# Patient Record
Sex: Female | Born: 1985 | Race: White | Hispanic: No | Marital: Married | Smoking: Never smoker
Health system: Southern US, Community
[De-identification: ages and names within clinical notes are randomized; demographics above are authoritative.]

## PROBLEM LIST (undated history)

## (undated) DIAGNOSIS — R0789 Other chest pain: Secondary | ICD-10-CM

## (undated) DIAGNOSIS — I471 Supraventricular tachycardia, unspecified: Secondary | ICD-10-CM

## (undated) DIAGNOSIS — R002 Palpitations: Secondary | ICD-10-CM

## (undated) HISTORY — DX: Palpitations: R00.2

## (undated) HISTORY — DX: Other chest pain: R07.89

## (undated) HISTORY — DX: Supraventricular tachycardia, unspecified: I47.10

## (undated) HISTORY — PX: CHOLECYSTECTOMY: SHX55

## (undated) HISTORY — DX: Supraventricular tachycardia: I47.1

## (undated) HISTORY — PX: APPENDECTOMY: SHX54

---
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
## 2003-09-01 ENCOUNTER — Encounter: Admission: RE | Admit: 2003-09-01 | Discharge: 2003-09-01 | Payer: Self-pay | Admitting: Family Medicine

## 2003-09-01 ENCOUNTER — Encounter (INDEPENDENT_AMBULATORY_CARE_PROVIDER_SITE_OTHER): Payer: Self-pay | Admitting: Specialist

## 2003-09-02 ENCOUNTER — Observation Stay (HOSPITAL_COMMUNITY): Admission: EM | Admit: 2003-09-02 | Discharge: 2003-09-02 | Payer: Self-pay | Admitting: Emergency Medicine

## 2003-10-13 ENCOUNTER — Other Ambulatory Visit: Admission: RE | Admit: 2003-10-13 | Discharge: 2003-10-13 | Payer: Self-pay | Admitting: Family Medicine

## 2004-04-22 IMAGING — CT CT ABDOMEN W/ CM
1 series · 15 of 32 positions shown, 19 images · IV contrast (GASTRO & OMNIPAQUE 100)
Comparison: none

CLINICAL DATA: Right lower quadrant pain.    Question appendicitis. 
 CT OF THE ABDOMEN AND PELVIS WITH IV CONTRAST 
 Multidetector helical study performed during IV contrast enhancement of 100 cc of Omnipaque 300 injected at 3 cc per second.  The liver, spleen, pancreas, kidneys and adrenal glands have a normal appearance.  There is no free peritoneal fluid or air.  The appendix is in a retrocecal location and is enlarged measuring 12 mm in diameter.  There is inflammatory change seen within the periappendiceal fat ? findings consistent with acute appendicitis.  There is no evidence for an abscess.
 IMPRESSION
 Findings are consistent with acute retrocecal appendicitis without abscess. 
 CT OF THE PELVIS WITH IV CONTRAST 
 There is a small amount of free pelvic fluid present.  There is a 1.7 x 3.3 cm in size right ovarian cyst.  There is no adenopathy or pelvic mass. There are changes consistent with retrocecal appendicitis as discussed on the CT of the abdomen report. 
 Retrocecal appendicitis.  Please see CT abdomen report. 
 [DATE] x 3.3 cm in size right ovarian cyst with a small amount of free pelvic fluid.

[Series 2: routine abdomen · axial · 0.64mm/px · z∈[-350,-10]mm · 15 of 105 slices shown, 19 images]
[im 7/105  soft-tissue]
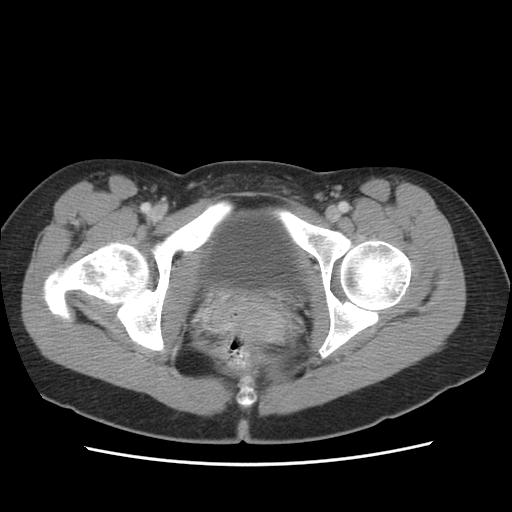
[im 7/105  bone]
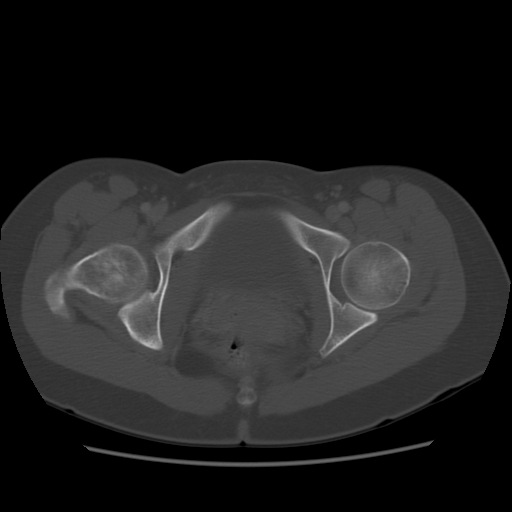
[im 14/105  soft-tissue]
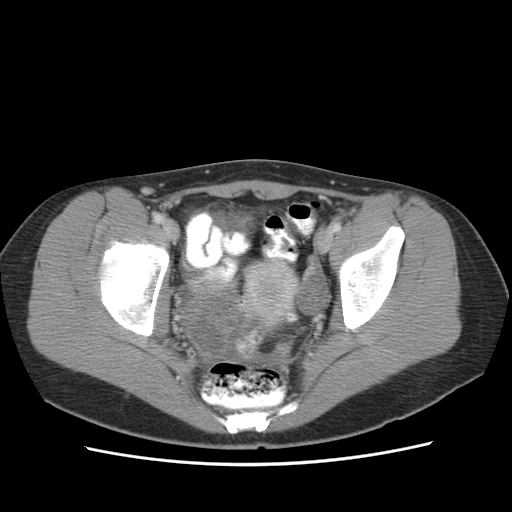
[im 21/105  soft-tissue]
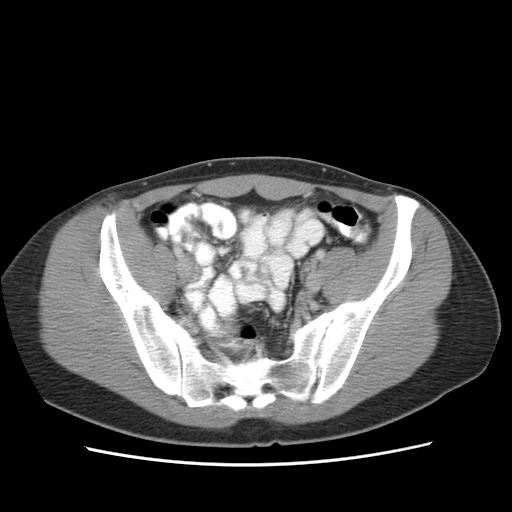
[im 31/105  soft-tissue]
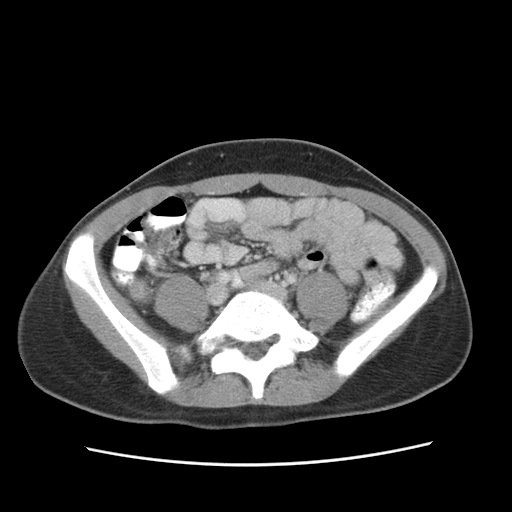
[im 37/105  soft-tissue]
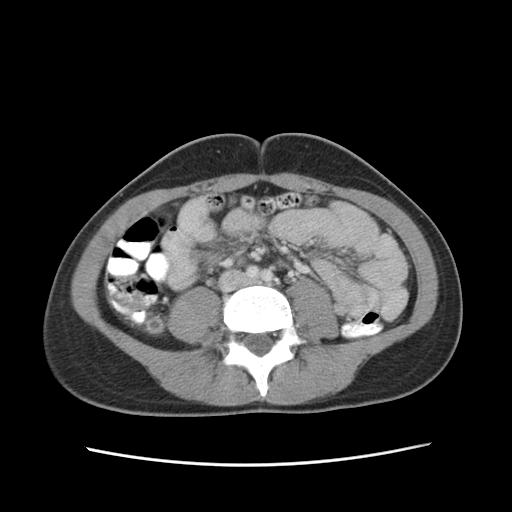
[im 44/105  soft-tissue]
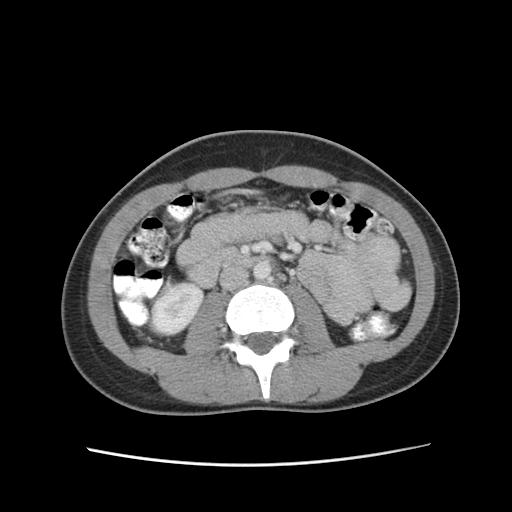
[im 54/105  soft-tissue]
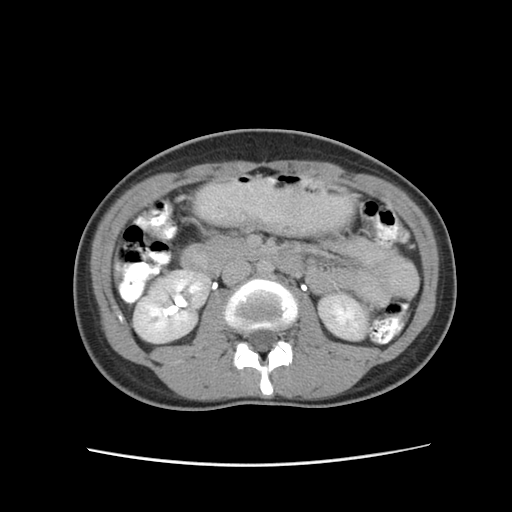
[im 61/105  soft-tissue]
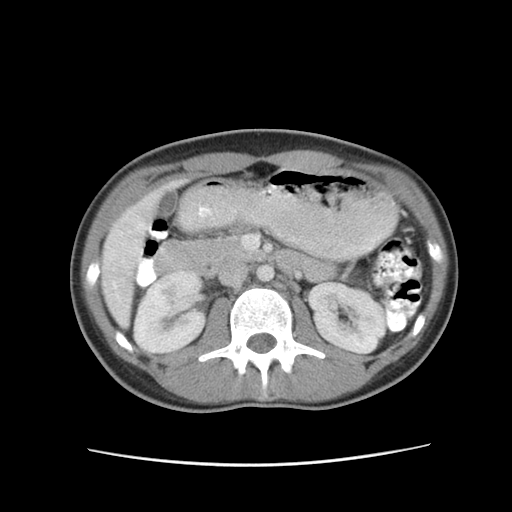
[im 68/105  soft-tissue]
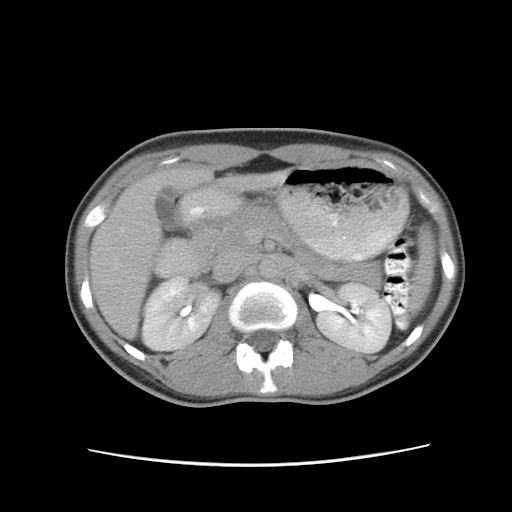
[im 68/105  bone]
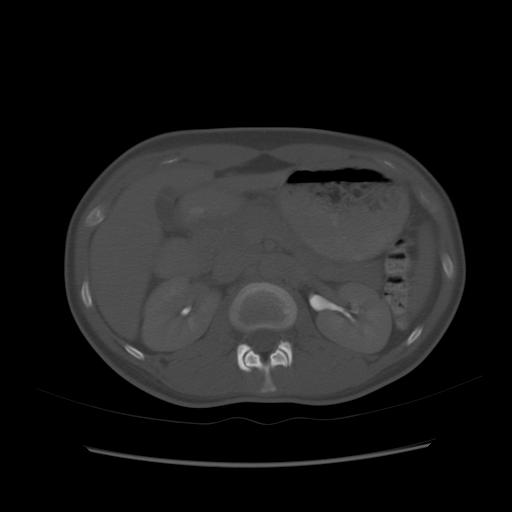
[im 74/105  soft-tissue]
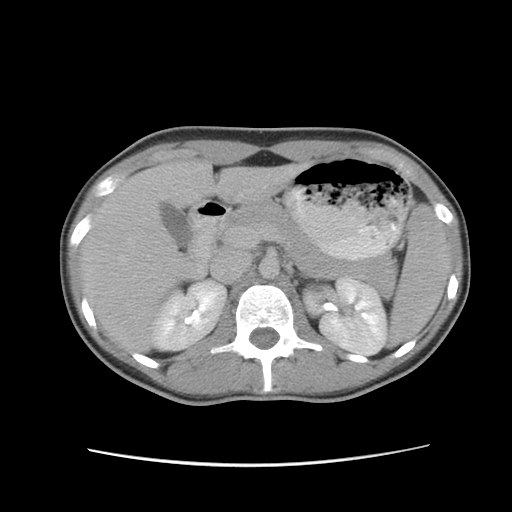
[im 84/105  soft-tissue]
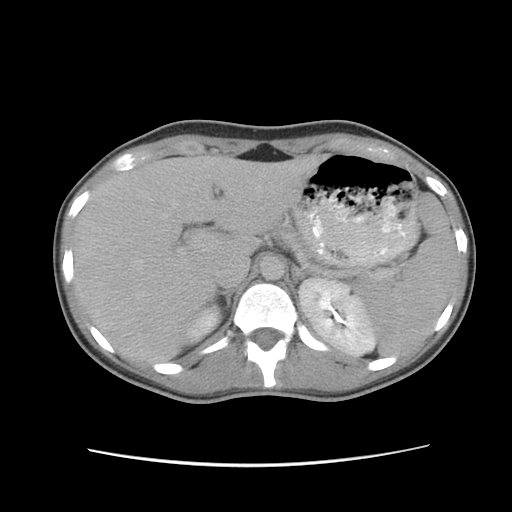
[im 91/105  soft-tissue]
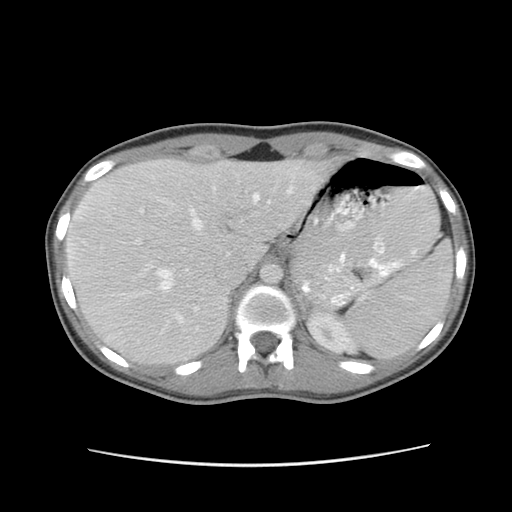
[im 91/105  lung]
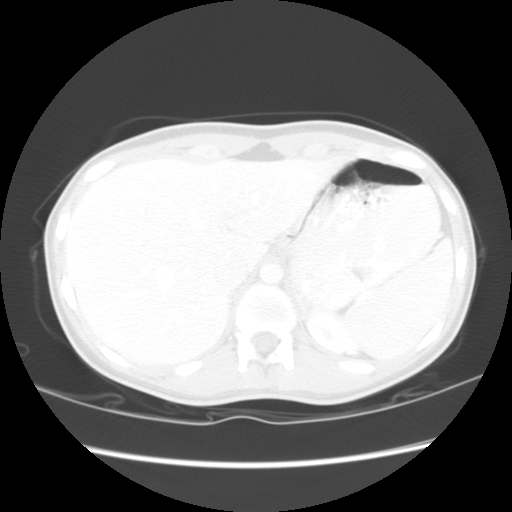
[im 94/105  lung]
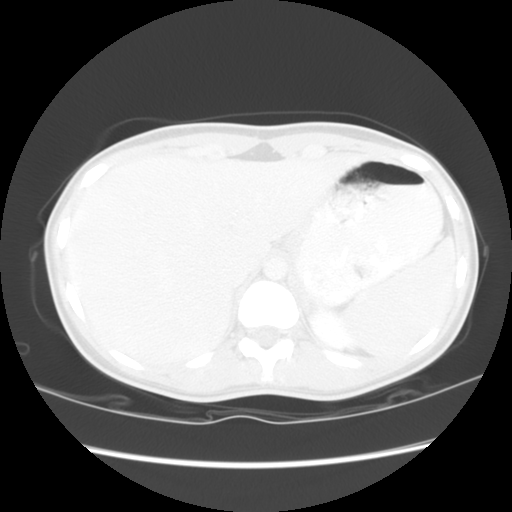
[im 98/105  soft-tissue]
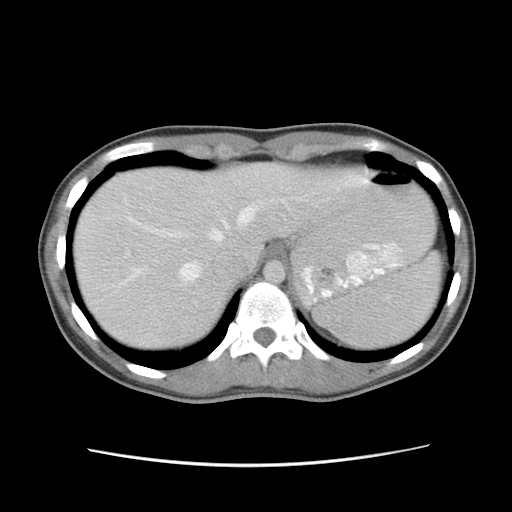
[im 98/105  lung]
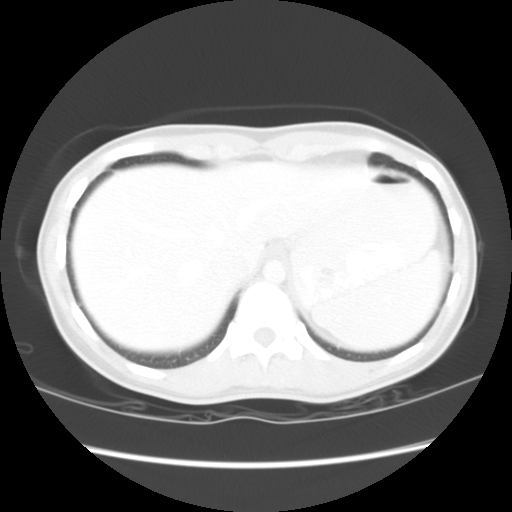
[im 101/105  lung]
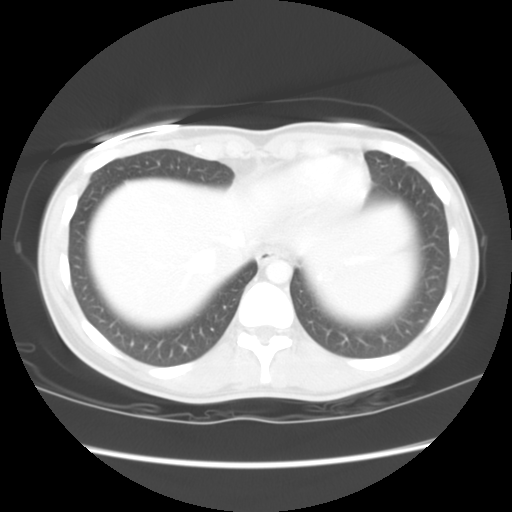

[15 of 32 positions shown; findings below may reference images not displayed]

## 2004-12-04 ENCOUNTER — Emergency Department (HOSPITAL_COMMUNITY): Admission: EM | Admit: 2004-12-04 | Discharge: 2004-12-04 | Payer: Self-pay | Admitting: Emergency Medicine

## 2004-12-13 ENCOUNTER — Inpatient Hospital Stay (HOSPITAL_COMMUNITY): Admission: AD | Admit: 2004-12-13 | Discharge: 2004-12-13 | Payer: Self-pay | Admitting: *Deleted

## 2004-12-22 ENCOUNTER — Inpatient Hospital Stay (HOSPITAL_COMMUNITY): Admission: RE | Admit: 2004-12-22 | Discharge: 2004-12-25 | Payer: Self-pay | Admitting: Obstetrics and Gynecology

## 2005-01-07 ENCOUNTER — Encounter: Admission: RE | Admit: 2005-01-07 | Discharge: 2005-01-12 | Payer: Self-pay | Admitting: Obstetrics and Gynecology

## 2005-02-02 ENCOUNTER — Other Ambulatory Visit: Admission: RE | Admit: 2005-02-02 | Discharge: 2005-02-02 | Payer: Self-pay | Admitting: Obstetrics and Gynecology

## 2005-03-05 ENCOUNTER — Emergency Department (HOSPITAL_COMMUNITY): Admission: EM | Admit: 2005-03-05 | Discharge: 2005-03-06 | Payer: Self-pay | Admitting: Emergency Medicine

## 2005-04-13 ENCOUNTER — Encounter (INDEPENDENT_AMBULATORY_CARE_PROVIDER_SITE_OTHER): Payer: Self-pay | Admitting: Specialist

## 2005-04-13 ENCOUNTER — Ambulatory Visit (HOSPITAL_COMMUNITY): Admission: RE | Admit: 2005-04-13 | Discharge: 2005-04-14 | Payer: Self-pay

## 2005-12-03 IMAGING — RF DG CHOLANGIOGRAM OPERATIVE
1 series · 4 of 4 positions shown · non-contrast
Comparison: none

CLINICAL DATA: Gallstones.   Cholecystectomy.
 INTRAOPERATIVE CHOLANGIOGRAM:
TECHNIQUE: Multiple fluoroscopic spot radiographs were obtained during intraoperative cholangiogram, and are submitted for interpretation post-operatively.

[Series 1: run · 4 of 75 frames shown]
[frame 12/75]
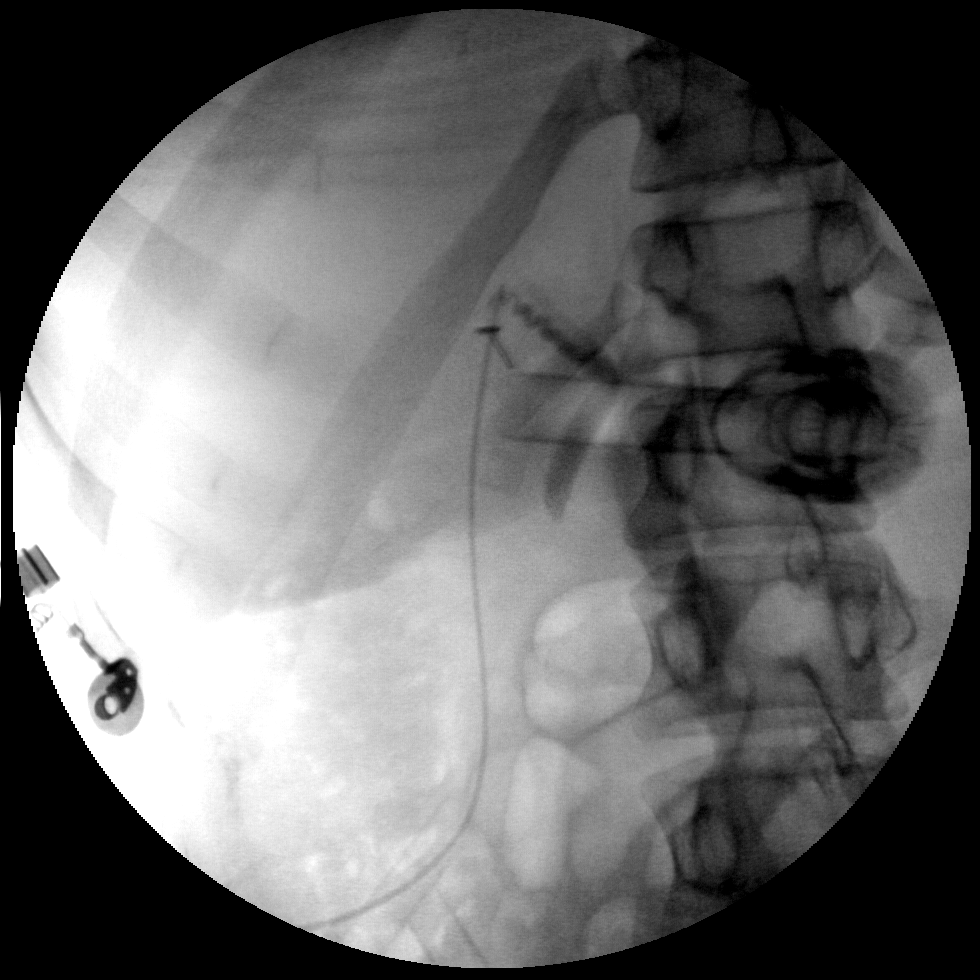
[frame 38/75]
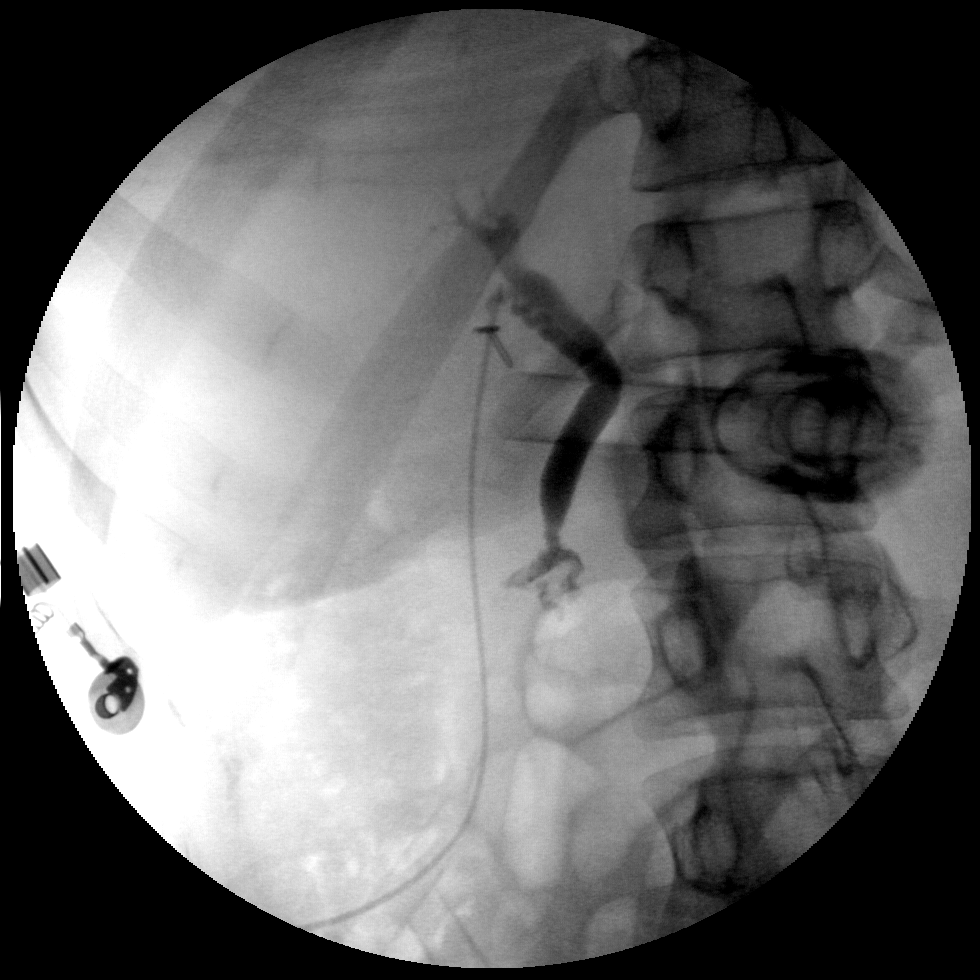
[frame 64/75]
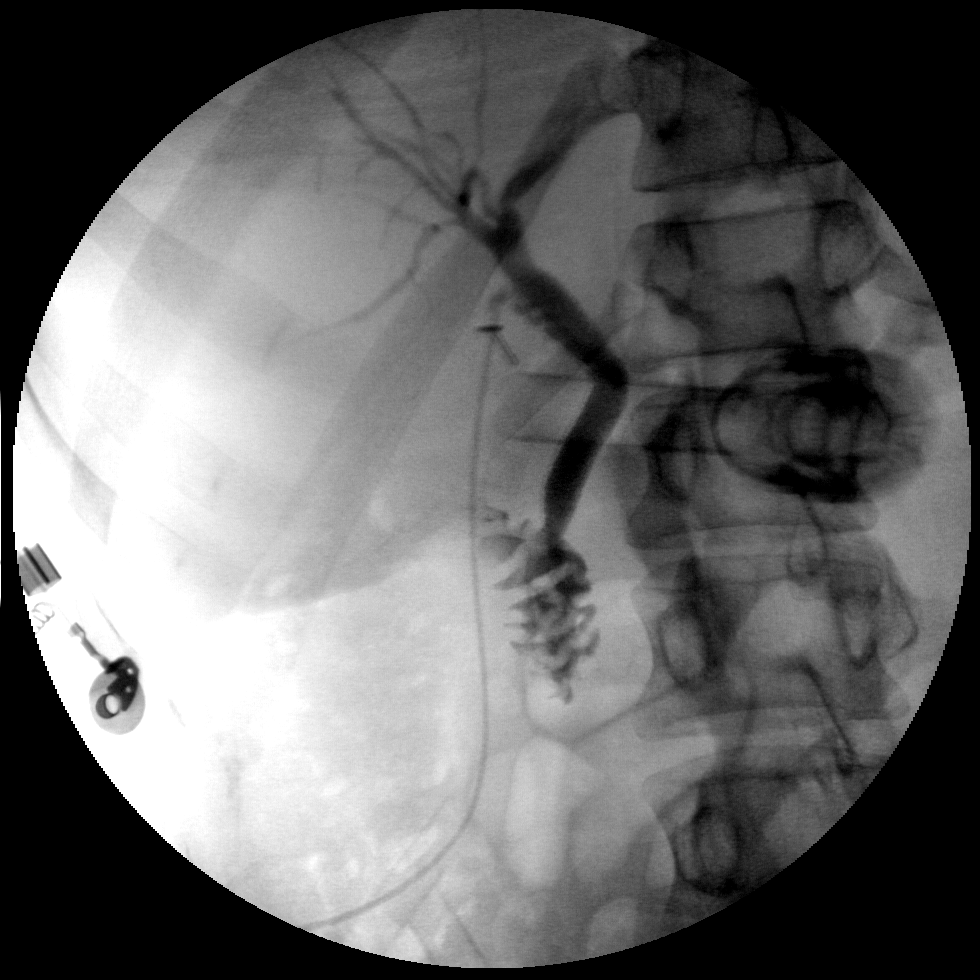
[frame 72/75]
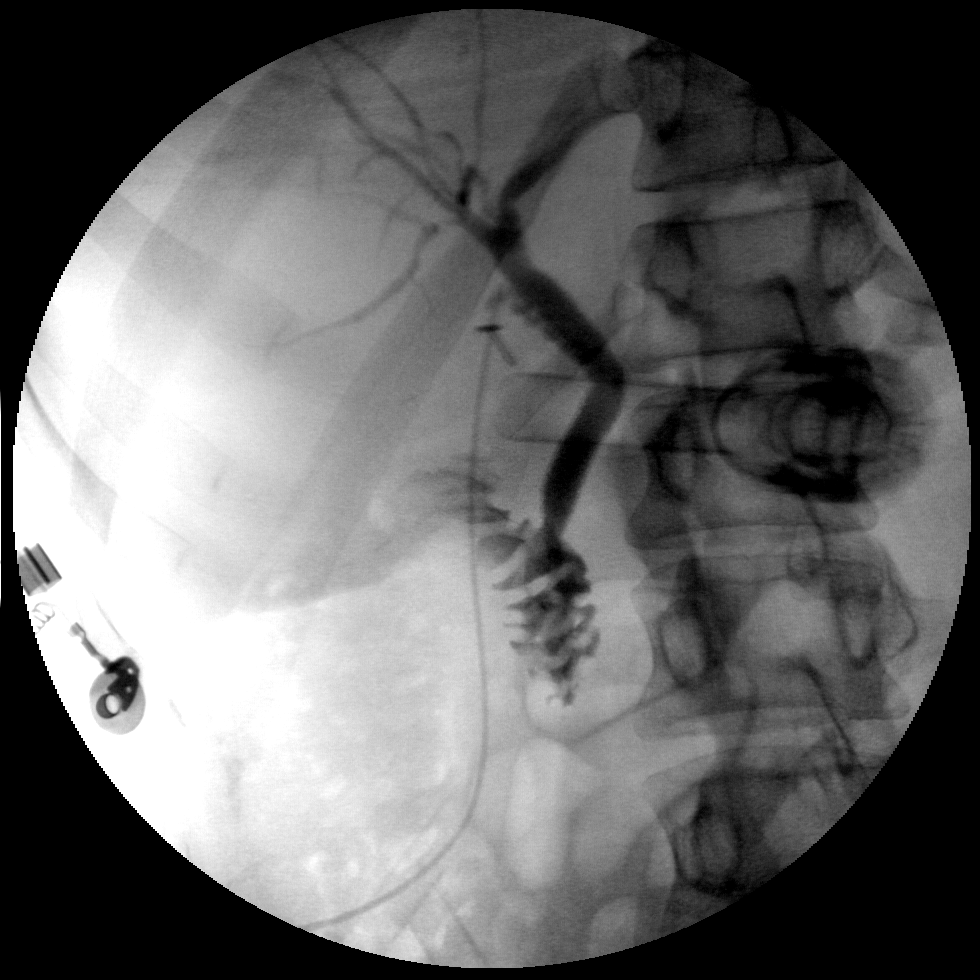

[4 of 4 positions shown; findings below may reference images not displayed]

FINDINGS: Cystic duct is injected with contrast with good filling of the bile ducts.   There are no filling defects or ductal dilatation.   There is contrast in the duodenum.
IMPRESSION: Negative intraoperative cholangiogram.

## 2008-09-07 ENCOUNTER — Ambulatory Visit: Payer: Self-pay | Admitting: Cardiovascular Disease

## 2008-09-07 ENCOUNTER — Encounter: Payer: Self-pay | Admitting: Cardiovascular Disease

## 2008-09-07 DIAGNOSIS — Z8679 Personal history of other diseases of the circulatory system: Secondary | ICD-10-CM | POA: Insufficient documentation

## 2008-09-17 ENCOUNTER — Encounter: Payer: Self-pay | Admitting: Cardiovascular Disease

## 2008-09-17 ENCOUNTER — Ambulatory Visit: Payer: Self-pay

## 2008-10-11 ENCOUNTER — Telehealth (INDEPENDENT_AMBULATORY_CARE_PROVIDER_SITE_OTHER): Payer: Self-pay | Admitting: *Deleted

## 2008-10-11 ENCOUNTER — Telehealth: Payer: Self-pay | Admitting: Cardiovascular Disease

## 2008-10-15 ENCOUNTER — Ambulatory Visit: Payer: Self-pay | Admitting: Cardiovascular Disease

## 2008-11-22 ENCOUNTER — Inpatient Hospital Stay (HOSPITAL_COMMUNITY): Admission: AD | Admit: 2008-11-22 | Discharge: 2008-11-22 | Payer: Self-pay | Admitting: Obstetrics and Gynecology

## 2008-12-11 DIAGNOSIS — I471 Supraventricular tachycardia: Secondary | ICD-10-CM

## 2008-12-13 ENCOUNTER — Ambulatory Visit: Payer: Self-pay | Admitting: Cardiovascular Disease

## 2009-01-03 ENCOUNTER — Inpatient Hospital Stay (HOSPITAL_COMMUNITY): Admission: RE | Admit: 2009-01-03 | Discharge: 2009-01-05 | Payer: Self-pay | Admitting: Obstetrics and Gynecology

## 2009-01-06 ENCOUNTER — Encounter: Admission: RE | Admit: 2009-01-06 | Discharge: 2009-02-05 | Payer: Self-pay | Admitting: Obstetrics and Gynecology

## 2009-02-06 ENCOUNTER — Encounter: Admission: RE | Admit: 2009-02-06 | Discharge: 2009-03-08 | Payer: Self-pay | Admitting: Obstetrics and Gynecology

## 2009-02-14 ENCOUNTER — Ambulatory Visit: Payer: Self-pay | Admitting: Internal Medicine

## 2009-03-03 ENCOUNTER — Encounter (INDEPENDENT_AMBULATORY_CARE_PROVIDER_SITE_OTHER): Payer: Self-pay | Admitting: *Deleted

## 2009-03-09 ENCOUNTER — Encounter: Admission: RE | Admit: 2009-03-09 | Discharge: 2009-03-14 | Payer: Self-pay | Admitting: Obstetrics and Gynecology

## 2010-10-01 LAB — CBC
HCT: 31.7 % — ABNORMAL LOW (ref 36.0–46.0)
HCT: 44.3 % (ref 36.0–46.0)
Hemoglobin: 11.1 g/dL — ABNORMAL LOW (ref 12.0–15.0)
Hemoglobin: 15.3 g/dL — ABNORMAL HIGH (ref 12.0–15.0)
MCHC: 34.5 g/dL (ref 30.0–36.0)
MCHC: 35.2 g/dL (ref 30.0–36.0)
MCV: 95.2 fL (ref 78.0–100.0)
MCV: 95.5 fL (ref 78.0–100.0)
Platelets: 125 10*3/uL — ABNORMAL LOW (ref 150–400)
Platelets: 128 10*3/uL — ABNORMAL LOW (ref 150–400)
RBC: 3.32 MIL/uL — ABNORMAL LOW (ref 3.87–5.11)
RBC: 4.66 MIL/uL (ref 3.87–5.11)
RDW: 13.5 % (ref 11.5–15.5)
RDW: 13.6 % (ref 11.5–15.5)
WBC: 11.1 10*3/uL — ABNORMAL HIGH (ref 4.0–10.5)
WBC: 14.1 10*3/uL — ABNORMAL HIGH (ref 4.0–10.5)

## 2010-10-01 LAB — CCBB MATERNAL DONOR DRAW

## 2010-10-01 LAB — ABO/RH: ABO/RH(D): A POS

## 2010-10-01 LAB — TYPE AND SCREEN
ABO/RH(D): A POS
Antibody Screen: NEGATIVE

## 2010-10-01 LAB — RPR: RPR Ser Ql: NONREACTIVE

## 2010-10-03 LAB — URINALYSIS, ROUTINE W REFLEX MICROSCOPIC
Bilirubin Urine: NEGATIVE
Glucose, UA: NEGATIVE mg/dL
Hgb urine dipstick: NEGATIVE
Ketones, ur: NEGATIVE mg/dL
Nitrite: NEGATIVE
Protein, ur: NEGATIVE mg/dL
Specific Gravity, Urine: 1.01 (ref 1.005–1.030)
Urobilinogen, UA: 0.2 mg/dL (ref 0.0–1.0)
pH: 7 (ref 5.0–8.0)

## 2010-10-03 LAB — URINE MICROSCOPIC-ADD ON

## 2010-10-03 LAB — FETAL FIBRONECTIN: Fetal Fibronectin: NEGATIVE

## 2010-11-07 NOTE — H&P (Signed)
Suzanne Li, Suzanne Li              ACCOUNT NO.:  [id number]   MEDICAL RECORD NO.:  [id number]          PATIENT TYPE:  INP   LOCATION:  NA                            [facility]  [facility]   PHYSICIAN:  Huel Cote, M.D. DATE OF BIRTH:  04/20/1986   DATE OF ADMISSION:  DATE OF DISCHARGE:                              HISTORY & PHYSICAL   HISTORY OF PRESENT ILLNESS:  The patient is a 25 year old G2, P1-0-0-1,  who is coming in for a scheduled repeat cesarean section at 39 weeks for  a persistent breech presentation and a prior C-section delivery.  Her  estimated due date is January 09, 2009.  She has had uneventful prenatal  care except for the prior breech presentation and this prior C-section  with a breech presenting this time as well.   PRENATAL LABS:  A positive, antibody negative, RPR nonreactive, rubella  immune, hepatitis B surface antigen negative, HIV negative, GC negative,  chlamydia negative, group B strep positive in her urine, and 1-hour  Glucola normal at 116.   PAST SURGICAL HISTORY:  Significant for the cesarean section in 2006  when she was delivered of a breech female infant 9 pounds 1 ounce.  She  also had an appendectomy performed in 2004 as well as a cholecystectomy  performed in 2004.   PAST MEDICAL HISTORY:  Insignificant.  She did have a cardiology workup  this pregnancy as she was having some symptoms of palpitations and had a  history of SVT since her teenage years, which had never had any  significant issues.  She saw [facility] this pregnancy as  stated, Dr. Eden Emms, did ultimately discuss beta-blockers and get placed  on Lopressor for symptomatic control.  She has done well on that  medication and had normal growth ultrasound with the baby measuring 95th  percentile on December 13, 2008.  Otherwise, she has a GYN history is  significant for a prior LEEP performed.  Most recent Pap smear performed  in December 2009, was normal.   She has no known drug  allergies.   MEDICATIONS:  Prenatal vitamins and the Lopressor as stated.   PHYSICAL EXAMINATION:  VITAL SIGNS:  Blood pressure is 120/80.  CARDIAC:  Regular rate and rhythm at the time of listening.  LUNGS:  Clear.  ABDOMEN:  Soft, gravid, nontender.  Cervix is 51 with a breech  presentation noted.  Again, the patient was counseled the risks and  benefits of repeat cesarean section.  She understands risk of bleeding,  infection and possible damage to bowel and bladder and desires to  proceed with this as stated.      Huel Cote, M.D.  Electronically Signed    KR/MEDQ  D:  12/31/2008  T:  01/01/2009  Job:  [id number]

## 2010-11-07 NOTE — Discharge Summary (Signed)
NAMEKYNLEY, METZGER              ACCOUNT NO.:  [id number]   MEDICAL RECORD NO.:  [id number]          PATIENT TYPE:  INP   LOCATION:  9103                          [facility]ILITY:  [facility]   PHYSICIAN:  Huel Cote, M.D. DATE OF BIRTH:  [birth year]   DATE OF ADMISSION:  01/03/2009  DATE OF DISCHARGE:  01/05/2009                               DISCHARGE SUMMARY   DISCHARGE DIAGNOSES:  1. Term pregnancy at 39 weeks delivered.  2. Status post repeat low-transverse cesarean section with 2 layer      closure of uterus for breech presentation.  3. Prior cesarean section.   DISCHARGE MEDICATIONS:  1. Motrin 600 mg p.o. every 6 hours.  2. Percocet 1-2 tablets p.o. every 4 hours p.r.n.   DISCHARGE FOLLOWUP:  The patient is to follow up for staple removal in  the office in 2 days.   HOSPITAL COURSE:  The patient is a 25 year old, G2, P1-0-0-1, who came  in for a scheduled repeat cesarean section given a finding of persistent  breech presentation and a previous C-section.  The patient underwent a  repeat low-transverse C-section with 2 layer closure of the uterus and  was delivered of a viable female infant, Apgars were 9 and 9, weight was  8 pounds 2 ounces, and her postoperative course was uneventful.  She was  admitted and on postop day #1 was doing quite well.  Hemoglobin was  11.1.  Incision was clear.  There was no erythema or exudate noted and  on postop day #2, the patient requested early discharge.  She was  feeling well.  She was afebrile with stable vital signs and incision was  clear and she was scheduled to have her staples removed in the office.  She was discharged to home with prescriptions for Percocet and Motrin  and our discharge booklet with instructions on pelvic rest and followup.      Huel Cote, M.D.  Electronically Signed     KR/MEDQ  D:  01/14/2009  T:  01/15/2009  Job:  [id number]

## 2010-11-07 NOTE — Op Note (Signed)
Suzanne Li, Suzanne Li              ACCOUNT NO.:  [id number]   MEDICAL RECORD NO.:  [id number]          PATIENT TYPE:  INP   LOCATION:  9103                          [facility]ILITY[facility]   PHYSICIAN:  Huel Cote, M.D. DATE OF BIRTH:  1985-07-25   DATE OF PROCEDURE:  01/03/2009  DATE OF DISCHARGE:                               OPERATIVE REPORT   PREOPERATIVE DIAGNOSES:  1. Term pregnancy at 39 weeks delivered.  2. Breech presentation.  3. Prior cesarean section.   POSTOPERATIVE DIAGNOSES:  1. Term pregnancy at 39 weeks delivered.  2. Breech presentation.  3. Prior cesarean section.   PROCEDURE:  Repeat low-transverse cesarean section with 2-layer closure  of uterus.   SURGEON:  Huel Cote, MD   ASSISTANT:  Malachi Pro. Ambrose Mantle, MD   ANESTHESIA:  Spinal.   FINDINGS:  There is a viable female infant in the breech presentation,  Apgars were 9 and 9, weight was 8 pounds 2 ounces.  Normal pelvic  anatomy was noted.   SPECIMEN:  Placenta was sent to pathology.   ESTIMATED BLOOD LOSS:  100 mL.   URINE OUTPUT:  325 mL clear urine.  LR about 1300 mL for IV fluids.   PROCEDURE:  The patient was taken to the operating room where spinal  anesthesia was obtained and found to be adequate by Allis clamp test.  She was then prepped and draped in the normal sterile fashion in the  dorsal supine position with a leftward tilt with a Foley catheter in  place.  A Pfannenstiel skin incision was then made through a preexisting  scar and carried through to underlying layer of fascia by sharp  dissection and Bovie cautery.  The fascia was then opened in the midline  and the incision was extended laterally with Mayo scissors.  The  inferior aspect was then grasped with Kocher clamps, elevated, and  dissected off the underlying rectus muscles in a similar fashion.  Superior aspect was dissected off the rectus muscles.  The rectus  muscles were then separated in the midline.  They were somewhat  fused  together with the midline not as apparent as usual.  However, the  peritoneal cavity was entered sharply and then careful attention was  made to extend the incision superiorly and inferiorly to avoid the bowel  and bladder.  The Alexis self-retaining wound retractor was then placed  within the incision and the lower uterine segment exposed nicely.  Lower  uterine segment was then exposed.  It was then incised in a transverse  fashion and the uterine cavity itself entered bluntly.  This was  extended bluntly and the infant's bottom was delivered without  difficulty.  The legs were reduced up to the level of the scapula.  The  infant was delivered and the arms reduced over the chest.  Finally, the  head was delivered in a flexed position and the nose and mouth bulb  suctioned and the cord cut and clamped and infant handed to awaiting  pediatricians.  The placenta was then expressed spontaneously and handed  off for cord blood donation.  The uterus  was cleared of all clot and  debris with moist lap sponge.  It was noted to be slightly atonic, but  responded well to bimanual massage and IV Pitocin.  The uterine incision  was then closed in 2 layers, the first a running locked layer of 1-0  chromic.  The second an imbricating layer of the same suture.  The tubes  and ovaries were inspected and found to be normal.  There was no active  bleeding noted and the gutters were cleared of all clots and debris.  Again, the incision appeared hemostatic with irrigation, therefore all  instruments and sponges were removed from the patient's abdomen.  The  rectus muscles were reapproximated and several interrupted mattress  sutures of 0 Vicryl.  The fascia was then closed with 0 Vicryl in a  running fashion and the skin was closed with staples.  Sponge, lap, and  needle counts were correct x2 again and the patient was taken to the  recovery room in good condition.      Huel Cote, M.D.   Electronically Signed     KR/MEDQ  D:  01/03/2009  T:  01/04/2009  Job:  [id number]

## 2011-02-20 ENCOUNTER — Ambulatory Visit: Payer: Self-pay | Admitting: Cardiovascular Disease

## 2011-03-06 ENCOUNTER — Encounter: Payer: Self-pay | Admitting: Cardiovascular Disease

## 2011-03-07 ENCOUNTER — Ambulatory Visit: Payer: Self-pay | Admitting: Cardiovascular Disease

## 2011-04-05 ENCOUNTER — Encounter: Payer: Self-pay | Admitting: Cardiovascular Disease

## 2011-04-05 ENCOUNTER — Ambulatory Visit (INDEPENDENT_AMBULATORY_CARE_PROVIDER_SITE_OTHER): Payer: BC Managed Care – PPO | Admitting: Cardiovascular Disease

## 2011-04-05 VITALS — BP 119/71 | HR 91 | Ht 63.0 in | Wt 126.0 lb

## 2011-04-05 DIAGNOSIS — I471 Supraventricular tachycardia: Secondary | ICD-10-CM

## 2011-04-05 DIAGNOSIS — I498 Other specified cardiac arrhythmias: Secondary | ICD-10-CM

## 2011-04-05 MED ORDER — PROPRANOLOL HCL 10 MG PO TABS: 10.0000 mg | ORAL_TABLET | Freq: Every day | ORAL | Status: DC

## 2011-04-05 NOTE — Progress Notes (Signed)
Suzanne Li is a pleasant 25 yo WF with a history of SVT  She reports initially having episodes of SVT at age 50. She describes episodes of abrupt onset and gradual termination of "rapid heart beat" with associated shortness of breath, weakness, fatigue, diaphresis, and presyncope. She has never passed out from tachycardia. She initially diagnosed herself with SVT after hooking herself up to a monitor while at work at [employer]  ER 5/10. She found a narrow complex mid RP tachycardia at 144 bpm (availabe in clinic chart) at that time. Her tachycardia was successfully terminated with adenosine. She reports that she was pregnant but was treated with beta blockers with good result. She delivered her baby 01/03/09 by C section without difficulty. She has stopped her beta blocker, with three subsequent episodes of SVT. These episodes last been 30 minutes and 5 hours. She denies recent episodes of chest pain, but has had exertional chest pain in the past. She has previously had a normal echo.   Saw [facility]red in 2010 and wanted to continue medical Rx not ablation.  He recommended beta blockade and verapamil if that didn't help  ROS: Denies fever, malais, weight loss, blurry vision, decreased visual acuity, cough, sputum, SOB, hemoptysis, pleuritic pain, palpitaitons, heartburn, abdominal pain, melena, lower extremity edema, claudication, or rash.  All other systems reviewed and negative  General: Affect appropriate Healthy:  appears stated age HEENT: normal Neck supple with no adenopathy JVP normal no bruits no thyromegaly Lungs clear with no wheezing and good diaphragmatic motion Heart:  S1/S2 no murmur,rub, gallop or click PMI normal Abdomen: benighn, BS positve, no tenderness, no AAA no bruit.  No HSM or HJR Distal pulses intact with no bruits No edema Neuro non-focal Skin warm and dry No muscular weakness   Current Outpatient Prescriptions  Medication Sig Dispense Refill  . Multiple Vitamin  (MULTIVITAMIN) capsule Take 1 capsule by mouth daily.        Marland Kitchen sulfamethoxazole-trimethoprim (BACTRIM DS) 800-160 MG per tablet as directed.        Allergies  Review of patient's allergies indicates no known allergies.  Electrocardiogram:  NSR 91 normal ECG  Assessment and Plan

## 2011-04-05 NOTE — Assessment & Plan Note (Signed)
Only one prolonged episode recently.  Will give PRN Inderal.  She does not want to take daily meds or consider ablation at this time.  F/U Allred 6 months

## 2011-04-05 NOTE — Patient Instructions (Signed)
Your physician wants you to follow-up in: 6 months with dr Johney Frame Bonita Quin will receive a reminder letter in the mail two months in advance. If you don't receive a letter, please call our office to schedule the follow-up appointment.   Take inderal 10 mg as needed for fast heart rate

## 2011-11-16 ENCOUNTER — Telehealth: Payer: Self-pay | Admitting: Internal Medicine

## 2011-11-16 NOTE — Telephone Encounter (Signed)
11-16-11 lmm @ 444pm for pt to call to set up 6 month fu appt due in April with allred/mt

## 2011-12-05 ENCOUNTER — Encounter: Payer: Self-pay | Admitting: Internal Medicine

## 2011-12-05 ENCOUNTER — Ambulatory Visit (INDEPENDENT_AMBULATORY_CARE_PROVIDER_SITE_OTHER): Payer: BC Managed Care – PPO | Admitting: Internal Medicine

## 2011-12-05 VITALS — BP 130/70 | HR 102 | Resp 18 | Ht 63.0 in | Wt 177.0 lb

## 2011-12-05 DIAGNOSIS — I471 Supraventricular tachycardia: Secondary | ICD-10-CM

## 2011-12-05 NOTE — Assessment & Plan Note (Signed)
Suzanne Li is a pleasant 26 yo WF with symptomatic SVT documented to be a narrow mid RP adenosine sensitive tachycardia.  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   Therapeutic strategies for SVT including medicine and ablation were discussed in detail with the patient today. Risk, benefits, and alternatives to EP study and radiofrequency ablation for tachycardia were also discussed in detail today.  She remains very clear that she does not wish to proceed with ablation. She will contact my office if she changes her mind. I will see her again in 6 months.

## 2011-12-05 NOTE — Progress Notes (Signed)
PCP: Ailene Ravel, MD Primary Cardiologist:  Dr Doneta Public is a 26 y.o. female who presents today for routine electrophysiology followup.  Since last being seen in our clinic, the patient reports doing well.  She has fairly frequent epsiodes of SVT. She reports abrupt onset of tachypalpitations which frequently occurs with bending over or lifting (especially with house work).  She finds that if she stops and rest that this improves, though she has had episodes which have lasted several hours.  She has been given prn inderol but has not taken it.  Today, she denies symptoms of palpitations, chest pain, shortness of breath,  lower extremity edema, dizziness, presyncope, or syncope.  The patient is otherwise without complaint today.   Past Medical History  Diagnosis Date  . Paroxysmal supraventricular tachycardia     adenosine sensitive  . Palpitations     history of  . Chest pain, atypical    Past Surgical History  Procedure Date  . Cholecystectomy   . Appendectomy   . Cesarean section     x2    Current Outpatient Prescriptions  Medication Sig Dispense Refill  . Multiple Vitamin (MULTIVITAMIN) capsule Take 1 capsule by mouth daily.        . propranolol (INDERAL) 10 MG tablet Take 1 tablet (10 mg total) by mouth daily.  30 tablet  12  . sulfamethoxazole-trimethoprim (BACTRIM DS) 800-160 MG per tablet as directed.        Physical Exam: Filed Vitals:   12/05/11 1434  BP: 130/70  Pulse: 102  Resp: 18  Height: 5\' 3"  (1.6 m)  Weight: 177 lb (80.287 kg)    GEN- The patient is well appearing, alert and oriented x 3 today.   Head- normocephalic, atraumatic Eyes-  Sclera clear, conjunctiva pink Ears- hearing intact Oropharynx- clear Lungs- Clear to ausculation bilaterally, normal work of breathing Heart- Regular rate and rhythm, no murmurs, rubs or gallops, PMI not laterally displaced GI- soft, NT, ND, + BS Extremities- no clubbing, cyanosis, or edema  ekg today  reveals sinus rhythm 102 bpm, PR 134, QRS 80, Qtc 437  Assessment and Plan:

## 2011-12-05 NOTE — Patient Instructions (Signed)
Your physician wants you to follow-up in: 6 months with Dr. Allred. You will receive a reminder letter in the mail two months in advance. If you don't receive a letter, please call our office to schedule the follow-up appointment.  

## 2021-08-04 DIAGNOSIS — L409 Psoriasis, unspecified: Secondary | ICD-10-CM | POA: Diagnosis not present

## 2021-08-04 DIAGNOSIS — L4050 Arthropathic psoriasis, unspecified: Secondary | ICD-10-CM | POA: Diagnosis not present

## 2021-08-04 DIAGNOSIS — Z1589 Genetic susceptibility to other disease: Secondary | ICD-10-CM | POA: Diagnosis not present

## 2021-08-04 DIAGNOSIS — Z6825 Body mass index (BMI) 25.0-25.9, adult: Secondary | ICD-10-CM | POA: Diagnosis not present

## 2021-08-04 DIAGNOSIS — E663 Overweight: Secondary | ICD-10-CM | POA: Diagnosis not present

## 2021-08-04 DIAGNOSIS — G5603 Carpal tunnel syndrome, bilateral upper limbs: Secondary | ICD-10-CM | POA: Diagnosis not present

## 2021-08-08 DIAGNOSIS — L4050 Arthropathic psoriasis, unspecified: Secondary | ICD-10-CM | POA: Diagnosis not present

## 2021-09-27 DIAGNOSIS — Z6826 Body mass index (BMI) 26.0-26.9, adult: Secondary | ICD-10-CM | POA: Diagnosis not present

## 2021-09-27 DIAGNOSIS — R002 Palpitations: Secondary | ICD-10-CM | POA: Diagnosis not present

## 2021-09-27 DIAGNOSIS — R079 Chest pain, unspecified: Secondary | ICD-10-CM | POA: Diagnosis not present

## 2021-10-04 DIAGNOSIS — L4050 Arthropathic psoriasis, unspecified: Secondary | ICD-10-CM | POA: Diagnosis not present

## 2021-10-11 NOTE — Progress Notes (Incomplete)
CARDIOLOGY CONSULT NOTE  ? ? ? ? ? ?Patient ID: ?Suzanne Li ?MRN: [id number] ?DOB/AGE: 09-27-1985 36 y.o. ? ?Admit date: (Not on file) ?Referring Physician: Hamrick/ Mikki Santee NP  ?Primary Physician: Ailene Ravel, MD ?Primary Cardiologist: New ?Reason for Consultation: PSVT/Palpitations Chest pain ? ?Active Problems: ?  * No active hospital problems. * ? ? ?HPI:  36 y.o. with history of SVT and palpitations referred by Dr Nathanial Rancher. Last seen in 2012 SVT at age 36 with previous termination with adenosine Seen by Allred 2010 and deferred ablation Rx with beta blockers for short time but d/c 09/28/21 seen by NP and complained of palpitations Not like previous sustained rapid tachycardia just skips associated with chest pain ECG normal no pre excitation Hct 35.6 TSH normal  ? ?*** ? ?ROS ?All other systems reviewed and negative except as noted above ? ?Past Medical History:  ?Diagnosis Date  ?? Chest pain, atypical   ?? Palpitations   ? history of  ?? Paroxysmal supraventricular tachycardia (HCC)   ? adenosine sensitive  ?  ?Family History  ?Problem Relation Age of Onset  ?? Coronary artery disease Maternal Grandfather 70  ?? Hypertension Other   ?     f.h.  ?  ?Social History  ? ?Socioeconomic History  ?? Marital status: Married  ?  Spouse name: Not on file  ?? Number of children: 2  ?? Years of education: Not on file  ?? Highest education level: Not on file  ?Occupational History  ?? Occupation: Charity fundraiser   ?  Employer: OTHER  ?  Comment: [employer]  ?Tobacco Use  ?? Smoking status: Never  ?? Smokeless tobacco: Not on file  ?Substance and Sexual Activity  ?? Alcohol use: No  ?? Drug use: No  ?? Sexual activity: Not on file  ?Other Topics Concern  ?? Not on file  ?Social History Narrative  ? 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 ?Social Determinants of Health  ? ?Financial Resource Strain: Not on file  ?Food Insecurity: Not on file  ?Transportation Needs: Not on file  ?Physical Activity:  Not on file  ?Stress: Not on file  ?Social Connections: Not on file  ?Intimate Partner Violence: Not on file  ?  ?Past Surgical History:  ?Procedure Laterality Date  ?? APPENDECTOMY    ?? CESAREAN SECTION    ? x2  ?? CHOLECYSTECTOMY    ?  ? ? ?Current Outpatient Medications:  ??  Multiple Vitamin (MULTIVITAMIN) capsule, Take 1 capsule by mouth daily.  , Disp: , Rfl:  ??  propranolol (INDERAL) 10 MG tablet, Take 1 tablet (10 mg total) by mouth daily., Disp: 30 tablet, Rfl: 12 ??  sulfamethoxazole-trimethoprim (BACTRIM DS) 800-160 MG per tablet, as directed., Disp: , Rfl:  ? ? ? ?Physical Exam: ?There were no vitals taken for this visit.  ? ?Affect appropriate ?Healthy:  appears stated age ?HEENT: normal ?Neck supple with no adenopathy ?JVP normal no bruits no thyromegaly ?Lungs clear with no wheezing and good diaphragmatic motion ?Heart:  S1/S2 no murmur, no rub, gallop or click ?PMI normal ?Abdomen: benighn, BS positve, no tenderness, no AAA ?no bruit.  No HSM or HJR ?Distal pulses intact with no bruits ?No edema ?Neuro non-focal ?Skin warm and dry ?No muscular weakness ? ? ?Labs: ?  ?Lab Results  ?Component Value Date  ? WBC 14.1 (H) 01/04/2009  ? HGB 11.1 DELTA CHECK NOTED (L) 01/04/2009  ? HCT 31.7 (L) 01/04/2009  ?  MCV 95.5 01/04/2009  ? PLT 125 (L) 01/04/2009  ? No results for input(s): NA, K, CL, CO2, BUN, CREATININE, CALCIUM, PROT, BILITOT, ALKPHOS, ALT, AST, GLUCOSE in the last 168 hours. ? ?Invalid input(s): LABALBU ?No results found for: CKTOTAL, CKMB, CKMBINDEX, TROPONINI No results found for: CHOL ?No results found for: HDL ?No results found for: LDLCALC ?No results found for: TRIG ?No results found for: CHOLHDL ?No results found for: LDLDIRECT  ?  ?Radiology: ?No results found. ? ?EKG: See HPI  *** ? ? ?ASSESSMENT AND PLAN:  ? ?Palpitations/SVT:  history of seen by EP 2013 declined ablation *** ?Chest Pain :  *** ? ?*** ? ?*** ? ?Signed: ?Charlton Haws ?10/11/2021, 8:43 AM ? ? ?

## 2021-10-12 NOTE — Progress Notes (Signed)
CARDIOLOGY CONSULT NOTE  ? ? ? ? ? ?Patient ID: ?Suzanne Li ?MRN: [id number] ?DOB/AGE: April 22, 1986 36 y.o. ? ?Admit date: (Not on file) ?Referring Physician: Hamrick/ Mikki Santee NP  ?Primary Physician: Care, [facility] ?Primary Cardiologist: New ?Reason for Consultation: PSVT/Palpitations Chest pain ? ?Active Problems: ?  * No active hospital problems. * ? ? ?HPI:  36 y.o. with history of SVT and palpitations referred by Dr Nathanial Rancher. Last seen in 2012 SVT at age 53 with previous termination with adenosine Seen by [facility] 2010 and deferred ablation Rx with beta blockers for short time but d/c 09/28/21 seen by NP and complained of palpitations Not like previous sustained rapid tachycardia just skips associated with chest pain ECG normal no pre excitation Hct 35.6 TSH normal  ? ?She had a bad 4 days last month after returning form [place]. 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 Has 82 / 36 yo at home doing alright  ? ?Symptoms worse at night Not worse with exertions No ETOH. No excess caffeine ? ?She clearly indicates these feel more like PAC/PVC;s and not her SVT   ? ?ROS ?All other systems reviewed and negative except as noted above ? ?Past Medical History:  ?Diagnosis Date  ? Chest pain, atypical   ? Palpitations   ? history of  ? Paroxysmal supraventricular tachycardia (HCC)   ? adenosine sensitive  ?  ?Family History  ?Problem Relation Age of Onset  ? Coronary artery disease Maternal Grandfather 90  ? Hypertension Other   ?     f.h.  ?  ?Social History  ? ?Socioeconomic History  ? Marital status: Married  ?  Spouse name: Not on file  ? Number of children: 2  ? Years of education: Not on file  ? Highest education level: Not on file  ?Occupational History  ? Occupation: Charity fundraiser   ?  Employer: OTHER  ?  Comment: [employer]  ?Tobacco Use  ? Smoking status: Never  ? Smokeless tobacco: Not on file  ?Substance and Sexual Activity  ? Alcohol use: No  ? Drug use: No  ? Sexual activity:  Not on file  ?Other Topics Concern  ? Not on file  ?Social History Narrative  ? 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 ?Social Determinants of Health  ? ?Financial Resource Strain: Not on file  ?Food Insecurity: Not on file  ?Transportation Needs: Not on file  ?Physical Activity: Not on file  ?Stress: Not on file  ?Social Connections: Not on file  ?Intimate Partner Violence: Not on file  ?  ?Past Surgical History:  ?Procedure Laterality Date  ? APPENDECTOMY    ? CESAREAN SECTION    ? x2  ? CHOLECYSTECTOMY    ?  ? ? ?Current Outpatient Medications:  ?  CALCIUM-VITAMIN D PO, Take 1 tablet by mouth daily., Disp: , Rfl:  ?  Ferrous Sulfate (IRON) 325 (65 Fe) MG TABS, Take 1 tablet by mouth daily., Disp: , Rfl:  ?  pantoprazole (PROTONIX) 20 MG tablet, Take 1 tablet by mouth daily., Disp: , Rfl:  ?  Multiple Vitamin (MULTIVITAMIN) capsule, Take 1 capsule by mouth daily.   (Patient not taking: Reported on 10/13/2021), Disp: , Rfl:  ?  propranolol (INDERAL) 10 MG tablet, Take 1 tablet (10 mg total) by mouth daily. (Patient not taking: Reported on 10/13/2021), Disp: 30 tablet, Rfl: 12 ?  sulfamethoxazole-trimethoprim (BACTRIM DS) 800-160 MG per tablet, as  directed. (Patient not taking: Reported on 10/13/2021), Disp: , Rfl:  ? ? ? ?Physical Exam: ?Blood pressure 110/86, pulse 71, height 5\' 3"  (1.6 m), weight 146 lb (66.2 kg), SpO2 98 %.  ? ?Affect appropriate ?Healthy:  appears stated age ?HEENT: normal ?Neck supple with no adenopathy ?JVP normal no bruits no thyromegaly ?Lungs clear with no wheezing and good diaphragmatic motion ?Heart:  S1/S2 no murmur, no rub, gallop or click ?PMI normal ?Abdomen: benighn, BS positve, no tenderness, no AAA ?no bruit.  No HSM or HJR ?Distal pulses intact with no bruits ?No edema ?Neuro non-focal ?Skin warm and dry ?No muscular weakness ? ? ?Labs: ?  ?Lab Results  ?Component Value Date  ? WBC 14.1 (H) 01/04/2009  ? HGB 11.1 DELTA CHECK NOTED (L) 01/04/2009  ? HCT 31.7 (L)  01/04/2009  ? MCV 95.5 01/04/2009  ? PLT 125 (L) 01/04/2009  ? No results for input(s): NA, K, CL, CO2, BUN, CREATININE, CALCIUM, PROT, BILITOT, ALKPHOS, ALT, AST, GLUCOSE in the last 168 hours. ? ?Invalid input(s): LABALBU ?No results found for: CKTOTAL, CKMB, CKMBINDEX, TROPONINI No results found for: CHOL ?No results found for: HDL ?No results found for: LDLCALC ?No results found for: TRIG ?No results found for: CHOLHDL ?No results found for: LDLDIRECT  ?  ?Radiology: ?No results found. ? ?EKG: See HPI  10/13/2021 NSR rate 71 normal  ? ? ?ASSESSMENT AND PLAN:  ? ?Palpitations/SVT:  history of seen by EP 2013 declined ablation No with more likely PAC/PVCls Baseline ECG is normal no pre excitation. Inderal PRN.  14 day monitor ?TTE  will have her f/u with EP ?Chest Pain :  atypical related to premature beats Normal ECG f/u ETT ? ?ETT ?TTE ?14 day monitor ?F/U EP ? ?No need for general cardiology f/u  ? ?  ? ?Signed: ?2014 ?10/13/2021, 12:00 PM ? ? ?

## 2021-10-13 ENCOUNTER — Encounter: Payer: Self-pay | Admitting: Cardiovascular Disease

## 2021-10-13 ENCOUNTER — Ambulatory Visit: Payer: 59 | Admitting: Cardiovascular Disease

## 2021-10-13 ENCOUNTER — Ambulatory Visit (INDEPENDENT_AMBULATORY_CARE_PROVIDER_SITE_OTHER): Payer: 59

## 2021-10-13 VITALS — BP 110/86 | HR 71 | Ht 63.0 in | Wt 146.0 lb

## 2021-10-13 DIAGNOSIS — I471 Supraventricular tachycardia: Secondary | ICD-10-CM

## 2021-10-13 DIAGNOSIS — R002 Palpitations: Secondary | ICD-10-CM | POA: Diagnosis not present

## 2021-10-13 DIAGNOSIS — R079 Chest pain, unspecified: Secondary | ICD-10-CM | POA: Diagnosis not present

## 2021-10-13 MED ORDER — PROPRANOLOL HCL 10 MG PO TABS: ORAL_TABLET | ORAL | 1 refills | Status: DC

## 2021-10-13 MED ORDER — PROPRANOLOL HCL 10 MG PO TABS: ORAL_TABLET | ORAL | 1 refills | Status: AC

## 2021-10-13 NOTE — Patient Instructions (Addendum)
Medication Instructions:  ?1) START Propranolol 10mg - take one tablet every 8 hours as needed for palpitations. ? ?Lab Work: ?If you have labs (blood work) drawn today and your tests are completely normal, you will receive your results only by: ?MyChart Message (if you have MyChart) OR ?A paper copy in the mail ?If you have any lab test that is abnormal or we need to change your treatment, we will call you to review the results. ? ?Testing/Procedures: ?Your physician has requested that you have an echocardiogram. Echocardiography is a painless test that uses sound waves to create images of your heart. It provides your doctor with information about the size and shape of your heart and how well your heart?s chambers and valves are working. This procedure takes approximately one hour. There are no restrictions for this procedure. ? ?Your physician has requested that you have an exercise tolerance test. For further information please visit . Please also follow instruction sheet, as given. ? ?Your physician recommends that you wear a monitor for 14 days.  See instructions on next page. ? ? ?Follow-Up: ? ?Your physician recommends that you schedule a follow-up appointment next available with Dr. [email or web address] ? ? ?At [facility], you and your health needs are our priority.  As part of our continuing mission to provide you with exceptional heart care, we have created designated Provider Care Teams.  These Care Teams include your primary Cardiologist (physician) and Advanced Practice Providers (APPs -  Physician Assistants and Nurse Practitioners) who all work together to provide you with the care you need, when you need it. ? ?We recommend signing up for the patient portal called "MyChart".  Sign up information is provided on this After Visit Summary.  MyChart is used to connect with patients for Virtual Visits (Telemedicine).  Patients are able to view lab/test results, encounter notes, upcoming  appointments, etc.  Non-urgent messages can be sent to your provider as well.   ?To learn more about what you can do with MyChart, go to [facility].   ? ?Your next appointment:   ?1 year(s) ? ?The format for your next appointment:   ?In Person ? ?Provider:   ?[email or web address], MD { ? ? ?Other Instructions ? ? ?Important Information About Sugar ?ZIO XT- Long Term Monitor Instructions ? ?Your physician has requested you wear a ZIO patch monitor for 14 days.  ?This is a single patch monitor. [employer] supplies one patch monitor per enrollment. Additional ?stickers are not available. Please do not apply patch if you will be having a Nuclear Stress Test,  ?Echocardiogram, Cardiac CT, MRI, or Chest Xray during the period you would be wearing the  ?monitor. The patch cannot be worn during these tests. You cannot remove and re-apply the  ?ZIO XT patch monitor.  ?Your ZIO patch monitor will be mailed 3 day USPS to your address on file. It may take 3-5 days  ?to receive your monitor after you have been enrolled.  ?Once you have received your monitor, please review the enclosed instructions. Your monitor  ?has already been registered assigning a specific monitor serial # to you. ? ?Billing and Patient Assistance Program Information ? ?We have supplied [employer] with any of your insurance information on file for billing purposes. ?[employer] offers a sliding scale Patient Assistance Program for patients that do not have  ?insurance, or whose insurance does not completely cover the cost of the ZIO monitor.  ?You must apply for the Patient Assistance Program to qualify for this  discounted rate.  ?To apply, please call [employer] at [phone], select option 4, select option 2, ask to apply for  ?Patient Assistance Program. Meredeth Ide will ask your household income, and how many people  ?are in your household. They will quote your out-of-pocket cost based on that information.  ?[employer] will also be able to set up a 4-month,  interest-free payment plan if needed. ? ?Applying the monitor ?  ?Shave hair from upper left chest.  ?Hold abrader disc by orange tab. Rub abrader in 40 strokes over the upper left chest as  ?indicated in your monitor instructions.  ?Clean area with 4 enclosed alcohol pads. Let dry.  ?Apply patch as indicated in monitor instructions. Patch will be placed under collarbone on left  ?side of chest with arrow pointing upward.  ?Rub patch adhesive wings for 2 minutes. Remove white label marked "1". Remove the white  ?label marked "2". Rub patch adhesive wings for 2 additional minutes.  ?While looking in a mirror, press and release button in center of patch. A small green light will  ?flash 3-4 times. This will be your only indicator that the monitor has been turned on.  ?Do not shower for the first 24 hours. You may shower after the first 24 hours.  ?Press the button if you feel a symptom. You will hear a small click. Record Date, Time and  ?Symptom in the Patient Logbook.  ?When you are ready to remove the patch, follow instructions on the last 2 pages of Patient  ?Logbook. Stick patch monitor onto the last page of Patient Logbook.  ?Place Patient Logbook in the blue and white box. Use locking tab on box and tape box closed  ?securely. The blue and white box has prepaid postage on it. Please place it in the mailbox as  ?soon as possible. Your physician should have your test results approximately 7 days after the  ?monitor has been mailed back to [facility].  ?Call [facility] at [phone] if you have questions regarding  ?your ZIO XT patch monitor. Call them immediately if you see an orange light blinking on your  ?monitor.  ?If your monitor falls off in less than 4 days, contact our Monitor department at [phone].  ?If your monitor becomes loose or falls off after 4 days call [employer] at [phone] for  ?suggestions on securing your monitor ? ? ? ? ?  ?

## 2021-10-13 NOTE — Progress Notes (Unsigned)
Enrolled patient for a 14 day Zio XT  monitor to be mailed to patients home  °

## 2021-10-17 DIAGNOSIS — R002 Palpitations: Secondary | ICD-10-CM | POA: Diagnosis not present

## 2021-10-17 DIAGNOSIS — I471 Supraventricular tachycardia: Secondary | ICD-10-CM | POA: Diagnosis not present

## 2021-10-23 ENCOUNTER — Ambulatory Visit: Payer: Self-pay | Admitting: Cardiovascular Disease

## 2021-10-23 ENCOUNTER — Other Ambulatory Visit (HOSPITAL_COMMUNITY): Payer: 59

## 2021-10-26 ENCOUNTER — Ambulatory Visit (HOSPITAL_COMMUNITY): Payer: 59 | Attending: Cardiology

## 2021-10-26 ENCOUNTER — Ambulatory Visit (INDEPENDENT_AMBULATORY_CARE_PROVIDER_SITE_OTHER): Payer: 59

## 2021-10-26 DIAGNOSIS — R079 Chest pain, unspecified: Secondary | ICD-10-CM

## 2021-10-26 DIAGNOSIS — R002 Palpitations: Secondary | ICD-10-CM | POA: Insufficient documentation

## 2021-10-26 DIAGNOSIS — I471 Supraventricular tachycardia: Secondary | ICD-10-CM

## 2021-10-26 LAB — ECHOCARDIOGRAM COMPLETE
Area-P 1/2: 4.04 cm2
S' Lateral: 2.7 cm

## 2021-10-27 LAB — EXERCISE TOLERANCE TEST
Angina Index: 0
Duke Treadmill Score: 5
Estimated workload: 11.7
Exercise duration (min): 10 min
Exercise duration (sec): 0 s
MPHR: 182 {beats}/min
Peak HR: 166 {beats}/min
Percent HR: 91 %
RPE: 17
Rest HR: 75 {beats}/min
ST Depression (mm): 1 mm

## 2021-11-09 DIAGNOSIS — R002 Palpitations: Secondary | ICD-10-CM | POA: Diagnosis not present

## 2021-11-09 DIAGNOSIS — I471 Supraventricular tachycardia: Secondary | ICD-10-CM | POA: Diagnosis not present

## 2021-11-21 ENCOUNTER — Encounter: Payer: Self-pay | Admitting: Cardiology

## 2021-11-21 ENCOUNTER — Ambulatory Visit: Payer: 59 | Admitting: Cardiology

## 2021-11-21 VITALS — BP 100/70 | HR 72 | Ht 63.5 in | Wt 147.4 lb

## 2021-11-21 DIAGNOSIS — I471 Supraventricular tachycardia: Secondary | ICD-10-CM

## 2021-11-21 DIAGNOSIS — R002 Palpitations: Secondary | ICD-10-CM

## 2021-11-21 DIAGNOSIS — Z8679 Personal history of other diseases of the circulatory system: Secondary | ICD-10-CM | POA: Diagnosis not present

## 2021-11-21 NOTE — Patient Instructions (Addendum)
Medication Instructions:  Your physician recommends that you continue on your current medications as directed. Please refer to the Current Medication list given to you today. *If you need a refill on your cardiac medications before your next appointment, please call your pharmacy*  Lab Work: None. If you have labs (blood work) drawn today and your tests are completely normal, you will receive your results only by: MyChart Message (if you have MyChart) OR A paper copy in the mail If you have any lab test that is abnormal or we need to change your treatment, we will call you to review the results.  Testing/Procedures: None.  Follow-Up: At [facility], you and your health needs are our priority.  As part of our continuing mission to provide you with exceptional heart care, we have created designated Provider Care Teams.  These Care Teams include your primary Cardiologist (physician) and Advanced Practice Providers (APPs -  Physician Assistants and Nurse Practitioners) who all work together to provide you with the care you need, when you need it.  Your physician wants you to follow-up in: As needed with Cameron Lambert, MD   We recommend signing up for the patient portal called "MyChart".  Sign up information is provided on this After Visit Summary.  MyChart is used to connect with patients for Virtual Visits (Telemedicine).  Patients are able to view lab/test results, encounter notes, upcoming appointments, etc.  Non-urgent messages can be sent to your provider as well.   To learn more about what you can do with MyChart, go to [email or web address].    Any Other Special Instructions Will Be Listed Below (If Applicable).         

## 2021-11-21 NOTE — Progress Notes (Signed)
Electrophysiology Office Note:    Date:  11/21/2021   ID:  Suzanne Li, DOB 09/21/1985, MRN [id number]  PCP:  Care, [facility]  [facility] Cardiologist:  Charlton Haws, MD  [facility] Electrophysiologist:  Lanier Prude, MD   Referring MD: Wendall Stade, MD   Chief Complaint: New patient consult for SVT/palpitations  History of Present Illness:    Suzanne Li is a 36 y.o. female who presents for an evaluation of SVT/palpitations s/p heart monitor at the request of Dr. Eden Emms. Their medical history includes paroxysmal supraventricular tachycardia (adenosine sensitive).  She has a history of SVT at age 30 with previous termination with adenosine. Last seen by Dr. Johney Frame 12/05/2011.  She saw Dr. Eden Emms on 10/13/2021, and reported palpitations usually worse at night. She noted that they felt more like PAC/PVC's and not similar to her SVT. Also complained of atypical chest pain related to her premature beats. On inderal as needed. ETT, TTE, and 14 day monitor were ordered. She was referred to EP for further evaluation.  She confirms a recent episode of palpitations lasting 4 days that she describes as "really weird." She had skipped beats and associated chest pain. There were no clear triggers for this event. She denies any similar recurring episodes. At this time her chest pain has resolved as well.  However, she continues to have episodes of SVT "here and there." Lately her SVT occurs about once a month, lasting from a few minutes to an hour. She has noticed that they seem to worsen with anxiety and when she is upset. She usually tries to hold her breath or bear down which seems to help.  She denies any shortness of breath, or peripheral edema. No lightheadedness, headaches, syncope, orthopnea, or PND.      Past Medical History:  Diagnosis Date   Chest pain, atypical    Palpitations    history of   Paroxysmal supraventricular tachycardia (HCC)     adenosine sensitive    Past Surgical History:  Procedure Laterality Date   APPENDECTOMY     CESAREAN SECTION     x2   CHOLECYSTECTOMY      Current Medications: Current Meds  Medication Sig   CALCIUM-VITAMIN D PO Take 1 tablet by mouth daily.   Ferrous Sulfate (IRON) 325 (65 Fe) MG TABS Take 1 tablet by mouth daily.   Multiple Vitamin (MULTIVITAMIN) capsule Take 1 capsule by mouth daily.   propranolol (INDERAL) 10 MG tablet Take one tablet by mouth as needed every 8 hours     Allergies:   Patient has no known allergies.   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  ROS:   Please see the history of present illness.    (+) Palpitations All other systems reviewed and are negative.  EKGs/Labs/Other Studies Reviewed:    The following studies were reviewed today:  10/2021  Monitor Patch Wear Time:  12 days and 6 hours (2023-04-25T12:42:23-0400 to 2023-05-07T19:05:03-399)   Patient had a min HR of 51 bpm, max HR of 164 bpm, and avg HR of 86 bpm. Predominant  underlying rhythm was Sinus Rhythm. Isolated SVEs were rare (<1.0%), SVE Couplets were rare (<1.0%), and SVE Triplets were rare (<1.0%). Isolated VEs were rare (<1.0%),  VE Couplets were rare (<1.0%), and no VE Triplets were present.   10/26/2021  Exercise Tolerance Test   1.0 mm of up sloping ST depression was noted.   Negative adequate stress test without evidence of ischemia at given workload.  10/26/2021  Echo  1. Left ventricular ejection fraction, by estimation, is 55 to 60%. Left ventricular ejection fraction by 3D volume is 59 %. The left ventricle has normal function. The left ventricle has no regional wall motion abnormalities. Left ventricular diastolic   parameters were normal.   2. Right ventricular systolic function is normal. The right ventricular  size is normal. There is normal pulmonary artery systolic pressure. The estimated right ventricular systolic pressure is 15.0 mmHg.   3. The mitral valve is normal in structure. Trivial mitral valve  regurgitation. No evidence of mitral stenosis.   4. The aortic valve is normal in structure. Aortic valve regurgitation is not visualized. No aortic stenosis is present.   5. Pulmonic valve regurgitation is moderate.   6. The inferior vena cava is normal in size with greater than 50%  respiratory variability, suggesting right atrial pressure of 3 mmHg.    EKG:   EKG is personally reviewed.  11/21/2021: EKG was not ordered.   Recent Labs: No results found for requested labs within last 8760 hours.   Recent Lipid Panel No results found for: CHOL, TRIG, HDL, CHOLHDL, VLDL, LDLCALC, LDLDIRECT  Physical Exam:    VS:  BP 100/70   Pulse 72   Ht 5' 3.5" (1.613 m)   Wt 147 lb 6.4 oz (66.9 kg)   SpO2 99%   BMI 25.70 kg/m     Wt Readings from Last 3 Encounters:  11/21/21 147 lb 6.4 oz (66.9 kg)  10/13/21 146 lb (66.2 kg)  12/05/11 177 lb (80.3 kg)     GEN: Well nourished, well developed in no acute distress HEENT: Normal NECK:  No JVD; No carotid bruits LYMPHATICS: No lymphadenopathy CARDIAC: RRR, no murmurs, rubs, gallops RESPIRATORY:  Clear to auscultation without rales, wheezing or rhonchi  ABDOMEN: Soft, non-tender, non-distended MUSCULOSKELETAL:  No edema; No deformity  SKIN: Warm and dry NEUROLOGIC:  Alert and oriented x 3 PSYCHIATRIC:  Normal affect       ASSESSMENT:    1. PSVT   2. Palpitations   3. History of cardiovascular disorder    PLAN:    In order of problems listed above:  #PSVT Decreasing burden of SVT over the last 10 years.  Now occurring about 1 time per month.  She is able to terminate the episodes by breath-hold.  Continue to monitor.  Propranolol available  as needed.  #Palpitations Unclear what these were from.  Possibly PACs or PVCs.  I recommended she purchase an [employer] Kardia Mobile to be able to obtain 1/32 rhythm strip during an episode if they were to recur.  Follow-up on an as-needed basis.    Medication Adjustments/Labs and Tests Ordered: Current medicines are reviewed at length with the patient today.  Concerns regarding medicines are outlined above.  No orders of the defined types were placed in this encounter.  No orders of the defined types were placed in this encounter.   I,Mathew Stumpf,acting as a Neurosurgeonscribe for Lanier PrudeAMERON T Criselda Starke, MD.,have documented all relevant documentation on the behalf of Lanier PrudeAMERON T Eulala Newcombe, MD,as directed by  Lanier PrudeAMERON T Haylen Bellotti, MD while in the presence of Lanier PrudeAMERON T Marston Mccadden, MD.  I, Lanier PrudeAMERON T Johathon Overturf, MD, have reviewed all documentation for this visit. The documentation on 11/21/21 for the exam, diagnosis, procedures, and orders are all accurate and complete.   Signed, Rossie Muskratameron T. Lalla BrothersLambert, MD, University Of Washington Medical CenterFACC, Cass County Memorial HospitalFHRS 11/21/2021 2:32 PM    Electrophysiology [facility]

## 2021-11-22 ENCOUNTER — Ambulatory Visit: Payer: 59 | Admitting: Cardiology

## 2021-11-29 DIAGNOSIS — Z01419 Encounter for gynecological examination (general) (routine) without abnormal findings: Secondary | ICD-10-CM | POA: Diagnosis not present

## 2021-11-29 DIAGNOSIS — Z79899 Other long term (current) drug therapy: Secondary | ICD-10-CM | POA: Diagnosis not present

## 2021-11-29 DIAGNOSIS — Z130 Encounter for screening for diseases of the blood and blood-forming organs and certain disorders involving the immune mechanism: Secondary | ICD-10-CM | POA: Diagnosis not present

## 2021-11-29 DIAGNOSIS — L4050 Arthropathic psoriasis, unspecified: Secondary | ICD-10-CM | POA: Diagnosis not present

## 2021-11-29 DIAGNOSIS — Z1389 Encounter for screening for other disorder: Secondary | ICD-10-CM | POA: Diagnosis not present

## 2021-11-29 DIAGNOSIS — N920 Excessive and frequent menstruation with regular cycle: Secondary | ICD-10-CM | POA: Diagnosis not present

## 2022-01-17 DIAGNOSIS — Z6825 Body mass index (BMI) 25.0-25.9, adult: Secondary | ICD-10-CM | POA: Diagnosis not present

## 2022-01-17 DIAGNOSIS — L4050 Arthropathic psoriasis, unspecified: Secondary | ICD-10-CM | POA: Diagnosis not present

## 2022-01-17 DIAGNOSIS — E663 Overweight: Secondary | ICD-10-CM | POA: Diagnosis not present

## 2022-01-17 DIAGNOSIS — L409 Psoriasis, unspecified: Secondary | ICD-10-CM | POA: Diagnosis not present

## 2022-01-17 DIAGNOSIS — Z1589 Genetic susceptibility to other disease: Secondary | ICD-10-CM | POA: Diagnosis not present

## 2022-01-17 DIAGNOSIS — G5603 Carpal tunnel syndrome, bilateral upper limbs: Secondary | ICD-10-CM | POA: Diagnosis not present

## 2022-05-21 DIAGNOSIS — G5603 Carpal tunnel syndrome, bilateral upper limbs: Secondary | ICD-10-CM | POA: Diagnosis not present

## 2022-05-21 DIAGNOSIS — L409 Psoriasis, unspecified: Secondary | ICD-10-CM | POA: Diagnosis not present

## 2022-05-21 DIAGNOSIS — M25552 Pain in left hip: Secondary | ICD-10-CM | POA: Diagnosis not present

## 2022-05-21 DIAGNOSIS — L4050 Arthropathic psoriasis, unspecified: Secondary | ICD-10-CM | POA: Diagnosis not present

## 2022-05-21 DIAGNOSIS — Z1589 Genetic susceptibility to other disease: Secondary | ICD-10-CM | POA: Diagnosis not present

## 2022-05-21 DIAGNOSIS — Z6826 Body mass index (BMI) 26.0-26.9, adult: Secondary | ICD-10-CM | POA: Diagnosis not present

## 2022-05-21 DIAGNOSIS — E663 Overweight: Secondary | ICD-10-CM | POA: Diagnosis not present

## 2022-08-22 DIAGNOSIS — E663 Overweight: Secondary | ICD-10-CM | POA: Diagnosis not present

## 2022-08-22 DIAGNOSIS — M25552 Pain in left hip: Secondary | ICD-10-CM | POA: Diagnosis not present

## 2022-08-22 DIAGNOSIS — Z1589 Genetic susceptibility to other disease: Secondary | ICD-10-CM | POA: Diagnosis not present

## 2022-08-22 DIAGNOSIS — G5603 Carpal tunnel syndrome, bilateral upper limbs: Secondary | ICD-10-CM | POA: Diagnosis not present

## 2022-08-22 DIAGNOSIS — L4050 Arthropathic psoriasis, unspecified: Secondary | ICD-10-CM | POA: Diagnosis not present

## 2022-08-22 DIAGNOSIS — L409 Psoriasis, unspecified: Secondary | ICD-10-CM | POA: Diagnosis not present

## 2022-08-22 DIAGNOSIS — Z6826 Body mass index (BMI) 26.0-26.9, adult: Secondary | ICD-10-CM | POA: Diagnosis not present

## 2022-12-13 DIAGNOSIS — Z3202 Encounter for pregnancy test, result negative: Secondary | ICD-10-CM | POA: Diagnosis not present

## 2022-12-13 DIAGNOSIS — R109 Unspecified abdominal pain: Secondary | ICD-10-CM | POA: Diagnosis not present

## 2022-12-13 DIAGNOSIS — Z6826 Body mass index (BMI) 26.0-26.9, adult: Secondary | ICD-10-CM | POA: Diagnosis not present

## 2022-12-13 DIAGNOSIS — N3000 Acute cystitis without hematuria: Secondary | ICD-10-CM | POA: Diagnosis not present

## 2022-12-23 DIAGNOSIS — R1031 Right lower quadrant pain: Secondary | ICD-10-CM | POA: Diagnosis not present

## 2022-12-23 DIAGNOSIS — N200 Calculus of kidney: Secondary | ICD-10-CM | POA: Diagnosis not present

## 2022-12-23 DIAGNOSIS — R109 Unspecified abdominal pain: Secondary | ICD-10-CM | POA: Diagnosis not present

## 2022-12-23 DIAGNOSIS — N23 Unspecified renal colic: Secondary | ICD-10-CM | POA: Diagnosis not present

## 2022-12-23 DIAGNOSIS — N132 Hydronephrosis with renal and ureteral calculous obstruction: Secondary | ICD-10-CM | POA: Diagnosis not present

## 2022-12-26 DIAGNOSIS — L409 Psoriasis, unspecified: Secondary | ICD-10-CM | POA: Diagnosis not present

## 2022-12-26 DIAGNOSIS — Z6827 Body mass index (BMI) 27.0-27.9, adult: Secondary | ICD-10-CM | POA: Diagnosis not present

## 2022-12-26 DIAGNOSIS — M25552 Pain in left hip: Secondary | ICD-10-CM | POA: Diagnosis not present

## 2022-12-26 DIAGNOSIS — G5603 Carpal tunnel syndrome, bilateral upper limbs: Secondary | ICD-10-CM | POA: Diagnosis not present

## 2022-12-26 DIAGNOSIS — M79641 Pain in right hand: Secondary | ICD-10-CM | POA: Diagnosis not present

## 2022-12-26 DIAGNOSIS — E663 Overweight: Secondary | ICD-10-CM | POA: Diagnosis not present

## 2022-12-26 DIAGNOSIS — Z1589 Genetic susceptibility to other disease: Secondary | ICD-10-CM | POA: Diagnosis not present

## 2022-12-26 DIAGNOSIS — L4050 Arthropathic psoriasis, unspecified: Secondary | ICD-10-CM | POA: Diagnosis not present

## 2022-12-31 DIAGNOSIS — N200 Calculus of kidney: Secondary | ICD-10-CM | POA: Diagnosis not present

## 2022-12-31 DIAGNOSIS — Z6826 Body mass index (BMI) 26.0-26.9, adult: Secondary | ICD-10-CM | POA: Diagnosis not present

## 2023-01-02 DIAGNOSIS — R1031 Right lower quadrant pain: Secondary | ICD-10-CM | POA: Diagnosis not present

## 2023-01-02 DIAGNOSIS — N200 Calculus of kidney: Secondary | ICD-10-CM | POA: Diagnosis not present

## 2023-01-02 DIAGNOSIS — N202 Calculus of kidney with calculus of ureter: Secondary | ICD-10-CM | POA: Diagnosis not present

## 2023-01-17 DIAGNOSIS — R1031 Right lower quadrant pain: Secondary | ICD-10-CM | POA: Diagnosis not present

## 2023-01-17 DIAGNOSIS — N200 Calculus of kidney: Secondary | ICD-10-CM | POA: Diagnosis not present
